# Patient Record
Sex: Female | Born: 1960 | Race: White | Hispanic: No | Marital: Married | Smoking: Former smoker
Health system: Southern US, Community
[De-identification: ages and names within clinical notes are randomized; demographics above are authoritative.]

## PROBLEM LIST (undated history)

## (undated) DIAGNOSIS — Z9889 Other specified postprocedural states: Secondary | ICD-10-CM

## (undated) DIAGNOSIS — Z789 Other specified health status: Secondary | ICD-10-CM

## (undated) DIAGNOSIS — E785 Hyperlipidemia, unspecified: Secondary | ICD-10-CM

## (undated) DIAGNOSIS — I10 Essential (primary) hypertension: Secondary | ICD-10-CM

## (undated) HISTORY — PX: BREAST BIOPSY: SHX20

## (undated) HISTORY — DX: Essential (primary) hypertension: I10

## (undated) HISTORY — PX: BREAST EXCISIONAL BIOPSY: SUR124

## (undated) HISTORY — PX: COLONOSCOPY: SHX174

## (undated) HISTORY — DX: Hyperlipidemia, unspecified: E78.5

## (undated) HISTORY — DX: Other specified postprocedural states: Z98.890

---

## 1999-08-18 ENCOUNTER — Other Ambulatory Visit: Admission: RE | Admit: 1999-08-18 | Discharge: 1999-08-18 | Payer: Self-pay | Admitting: Obstetrics & Gynecology

## 2001-11-22 ENCOUNTER — Other Ambulatory Visit: Admission: RE | Admit: 2001-11-22 | Discharge: 2001-11-22 | Payer: Self-pay | Admitting: Obstetrics & Gynecology

## 2002-12-05 ENCOUNTER — Other Ambulatory Visit: Admission: RE | Admit: 2002-12-05 | Discharge: 2002-12-05 | Payer: Self-pay | Admitting: Obstetrics & Gynecology

## 2003-07-24 ENCOUNTER — Other Ambulatory Visit: Admission: RE | Admit: 2003-07-24 | Discharge: 2003-07-24 | Payer: Self-pay | Admitting: Obstetrics & Gynecology

## 2004-04-13 ENCOUNTER — Other Ambulatory Visit: Admission: RE | Admit: 2004-04-13 | Discharge: 2004-04-13 | Payer: Self-pay | Admitting: Obstetrics & Gynecology

## 2004-04-13 ENCOUNTER — Encounter: Admission: RE | Admit: 2004-04-13 | Discharge: 2004-04-13 | Payer: Self-pay | Admitting: Obstetrics & Gynecology

## 2005-11-09 ENCOUNTER — Encounter: Admission: RE | Admit: 2005-11-09 | Discharge: 2005-11-09 | Payer: Self-pay | Admitting: Obstetrics & Gynecology

## 2006-06-15 ENCOUNTER — Encounter: Admission: RE | Admit: 2006-06-15 | Discharge: 2006-06-15 | Payer: Self-pay | Admitting: Obstetrics & Gynecology

## 2007-06-08 ENCOUNTER — Encounter: Admission: RE | Admit: 2007-06-08 | Discharge: 2007-06-08 | Payer: Self-pay | Admitting: Obstetrics & Gynecology

## 2007-07-05 ENCOUNTER — Encounter: Admission: RE | Admit: 2007-07-05 | Discharge: 2007-07-05 | Payer: Self-pay | Admitting: Obstetrics & Gynecology

## 2008-08-12 ENCOUNTER — Encounter: Admission: RE | Admit: 2008-08-12 | Discharge: 2008-08-12 | Payer: Self-pay | Admitting: Obstetrics & Gynecology

## 2009-08-14 ENCOUNTER — Encounter: Admission: RE | Admit: 2009-08-14 | Discharge: 2009-08-14 | Payer: Self-pay | Admitting: Specialist

## 2009-08-15 ENCOUNTER — Encounter: Admission: RE | Admit: 2009-08-15 | Discharge: 2009-08-15 | Payer: Self-pay | Admitting: Obstetrics & Gynecology

## 2010-06-28 HISTORY — PX: ABDOMINAL HYSTERECTOMY: SHX81

## 2010-07-29 ENCOUNTER — Other Ambulatory Visit: Payer: Self-pay | Admitting: Obstetrics & Gynecology

## 2010-07-29 DIAGNOSIS — Z1239 Encounter for other screening for malignant neoplasm of breast: Secondary | ICD-10-CM

## 2010-08-17 ENCOUNTER — Ambulatory Visit
Admission: RE | Admit: 2010-08-17 | Discharge: 2010-08-17 | Disposition: A | Discharge status: Home / Self Care | Payer: Federal, State, Local not specified - PPO | Source: Ambulatory Visit | Attending: Obstetrics & Gynecology | Admitting: Obstetrics & Gynecology

## 2010-08-17 DIAGNOSIS — Z1239 Encounter for other screening for malignant neoplasm of breast: Secondary | ICD-10-CM

## 2011-03-31 ENCOUNTER — Other Ambulatory Visit: Payer: Self-pay | Admitting: Obstetrics & Gynecology

## 2011-03-31 DIAGNOSIS — N6323 Unspecified lump in the left breast, lower outer quadrant: Secondary | ICD-10-CM

## 2011-03-31 DIAGNOSIS — N6321 Unspecified lump in the left breast, upper outer quadrant: Secondary | ICD-10-CM

## 2011-04-12 ENCOUNTER — Other Ambulatory Visit: Payer: Federal, State, Local not specified - PPO

## 2011-04-15 ENCOUNTER — Ambulatory Visit
Admission: RE | Admit: 2011-04-15 | Discharge: 2011-04-15 | Disposition: A | Discharge status: Home / Self Care | Payer: Federal, State, Local not specified - PPO | Source: Ambulatory Visit | Attending: Obstetrics & Gynecology | Admitting: Obstetrics & Gynecology

## 2011-04-15 DIAGNOSIS — N6321 Unspecified lump in the left breast, upper outer quadrant: Secondary | ICD-10-CM

## 2011-04-15 DIAGNOSIS — N6323 Unspecified lump in the left breast, lower outer quadrant: Secondary | ICD-10-CM

## 2012-02-11 ENCOUNTER — Other Ambulatory Visit: Payer: Self-pay | Admitting: Obstetrics & Gynecology

## 2012-02-11 DIAGNOSIS — Z803 Family history of malignant neoplasm of breast: Secondary | ICD-10-CM

## 2012-02-11 DIAGNOSIS — Z1231 Encounter for screening mammogram for malignant neoplasm of breast: Secondary | ICD-10-CM

## 2012-03-03 ENCOUNTER — Ambulatory Visit: Payer: Federal, State, Local not specified - PPO

## 2012-03-20 ENCOUNTER — Other Ambulatory Visit: Payer: Self-pay | Admitting: Obstetrics & Gynecology

## 2012-03-20 DIAGNOSIS — Z803 Family history of malignant neoplasm of breast: Secondary | ICD-10-CM

## 2012-04-14 ENCOUNTER — Ambulatory Visit: Payer: Federal, State, Local not specified - PPO

## 2012-04-14 ENCOUNTER — Other Ambulatory Visit: Payer: Federal, State, Local not specified - PPO

## 2012-04-21 ENCOUNTER — Ambulatory Visit
Admission: RE | Admit: 2012-04-21 | Discharge: 2012-04-21 | Disposition: A | Discharge status: Home / Self Care | Payer: Federal, State, Local not specified - PPO | Source: Ambulatory Visit | Attending: Obstetrics & Gynecology | Admitting: Obstetrics & Gynecology

## 2012-04-21 DIAGNOSIS — Z1231 Encounter for screening mammogram for malignant neoplasm of breast: Secondary | ICD-10-CM

## 2012-04-21 DIAGNOSIS — Z803 Family history of malignant neoplasm of breast: Secondary | ICD-10-CM

## 2012-04-21 MED ORDER — GADOBENATE DIMEGLUMINE 529 MG/ML IV SOLN
15.0000 mL | Freq: Once | INTRAVENOUS | Status: AC | PRN
  Administered 2012-04-21: 15 mL via INTRAVENOUS

## 2012-10-26 DIAGNOSIS — Z9889 Other specified postprocedural states: Secondary | ICD-10-CM

## 2012-10-26 HISTORY — DX: Other specified postprocedural states: Z98.890

## 2013-04-06 ENCOUNTER — Other Ambulatory Visit: Payer: Self-pay

## 2013-04-06 DIAGNOSIS — Z1231 Encounter for screening mammogram for malignant neoplasm of breast: Secondary | ICD-10-CM

## 2013-04-17 ENCOUNTER — Other Ambulatory Visit: Payer: Self-pay | Admitting: Obstetrics & Gynecology

## 2013-04-17 DIAGNOSIS — N63 Unspecified lump in unspecified breast: Secondary | ICD-10-CM

## 2013-04-30 ENCOUNTER — Other Ambulatory Visit: Payer: Federal, State, Local not specified - PPO

## 2013-05-10 ENCOUNTER — Ambulatory Visit
Admission: RE | Admit: 2013-05-10 | Discharge: 2013-05-10 | Disposition: A | Discharge status: Home / Self Care | Payer: Federal, State, Local not specified - PPO | Source: Ambulatory Visit | Attending: Obstetrics & Gynecology | Admitting: Obstetrics & Gynecology

## 2013-05-10 ENCOUNTER — Other Ambulatory Visit: Payer: Self-pay | Admitting: Obstetrics & Gynecology

## 2013-05-10 DIAGNOSIS — N63 Unspecified lump in unspecified breast: Secondary | ICD-10-CM

## 2013-05-10 DIAGNOSIS — N6320 Unspecified lump in the left breast, unspecified quadrant: Secondary | ICD-10-CM

## 2013-05-18 ENCOUNTER — Ambulatory Visit
Admission: RE | Admit: 2013-05-18 | Discharge: 2013-05-18 | Disposition: A | Discharge status: Home / Self Care | Payer: Federal, State, Local not specified - PPO | Source: Ambulatory Visit | Attending: Obstetrics & Gynecology | Admitting: Obstetrics & Gynecology

## 2013-05-18 ENCOUNTER — Other Ambulatory Visit: Payer: Self-pay | Admitting: Obstetrics & Gynecology

## 2013-05-18 DIAGNOSIS — N6320 Unspecified lump in the left breast, unspecified quadrant: Secondary | ICD-10-CM

## 2013-05-25 ENCOUNTER — Ambulatory Visit: Payer: Federal, State, Local not specified - PPO

## 2013-05-30 ENCOUNTER — Ambulatory Visit (INDEPENDENT_AMBULATORY_CARE_PROVIDER_SITE_OTHER): Payer: Federal, State, Local not specified - PPO | Admitting: Surgery

## 2013-06-08 ENCOUNTER — Ambulatory Visit (INDEPENDENT_AMBULATORY_CARE_PROVIDER_SITE_OTHER): Payer: Federal, State, Local not specified - PPO | Admitting: Surgery

## 2013-06-08 ENCOUNTER — Encounter (INDEPENDENT_AMBULATORY_CARE_PROVIDER_SITE_OTHER): Payer: Self-pay

## 2013-06-08 ENCOUNTER — Encounter (INDEPENDENT_AMBULATORY_CARE_PROVIDER_SITE_OTHER): Payer: Self-pay | Admitting: Surgery

## 2013-06-08 VITALS — BP 122/76 | HR 76 | Temp 98.5°F | Resp 18 | Ht 70.0 in | Wt 161.6 lb

## 2013-06-08 DIAGNOSIS — N6099 Unspecified benign mammary dysplasia of unspecified breast: Secondary | ICD-10-CM | POA: Insufficient documentation

## 2013-06-08 DIAGNOSIS — N62 Hypertrophy of breast: Secondary | ICD-10-CM

## 2013-06-08 NOTE — Progress Notes (Signed)
Patient ID: RUDINE RIEGER, female   DOB: 10-16-1960, 52 y.o.   MRN: [id number]  Chief Complaint  Patient presents with  . Other    breast atypical hyperplasia    HPI Gerilynn S Mickel is a 52 y.o. female.   HPI This is a very pleasant female referred by the breast center and Dr. Derinda Late for evaluation of a recent diagnosis of atypical lobular hyperplasia of left breast. This was found on recent mammography and biopsy. 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 She has never needed a biopsy in the past. She has a family history of breast cancer in her mother and sister. She is otherwise without complaints. She denies nipple discharge. Past Medical History  Diagnosis Date  . H/O colonoscopy 10/2012    Past Surgical History  Procedure Laterality Date  . Abdominal hysterectomy      History reviewed. No pertinent family history.  Social History History  Substance Use Topics  . Smoking status: Former Games developer  . Smokeless tobacco: Not on file  . Alcohol Use: Yes     Comment: 2-4 glasses of wine    No Known Allergies  Current Outpatient Prescriptions  Medication Sig Dispense Refill  . hydrochlorothiazide (HYDRODIURIL) 50 MG tablet        No current facility-administered medications for this visit.    Review of Systems Review of Systems  Constitutional: Negative for fever, chills and unexpected weight change.  HENT: Negative for congestion, hearing loss, sore throat, trouble swallowing and voice change.   Eyes: Negative for visual disturbance.  Respiratory: Negative for cough and wheezing.   Cardiovascular: Negative for chest pain, palpitations and leg swelling.  Gastrointestinal: Negative for nausea, vomiting, abdominal pain, diarrhea, constipation, blood in stool, abdominal distention and anal bleeding.  Genitourinary: Negative for hematuria, vaginal bleeding and difficulty urinating.  Musculoskeletal: Negative for arthralgias.  Skin:  Negative for rash and wound.  Neurological: Negative for seizures, syncope and headaches.  Hematological: Negative for adenopathy. Does not bruise/bleed easily.  Psychiatric/Behavioral: Negative for confusion.    Blood pressure 122/76, pulse 76, temperature 98.5 F (36.9 C), temperature source Temporal, resp. rate 18, height 5\' 10"  (1.778 m), weight 161 lb 9.6 oz (73.301 kg).  Physical Exam Physical Exam  Constitutional: She is oriented to person, place, and time. She appears well-developed and well-nourished. No distress.  HENT:  Head: Normocephalic and atraumatic.  Right Ear: External ear normal.  Left Ear: External ear normal.  Nose: Nose normal.  Mouth/Throat: Oropharynx is clear and moist. No oropharyngeal exudate.  Eyes: Conjunctivae are normal. Pupils are equal, round, and reactive to light. Right eye exhibits no discharge. Left eye exhibits no discharge. No scleral icterus.  Neck: Normal range of motion. Neck supple. No tracheal deviation present. No thyromegaly present.  Cardiovascular: Normal rate, regular rhythm, normal heart sounds and intact distal pulses.   No murmur heard. Pulmonary/Chest: Effort normal and breath sounds normal. No respiratory distress. She has no wheezes. She has no rales.  Musculoskeletal: Normal range of motion. She exhibits no edema and no tenderness.  Lymphadenopathy:    She has no cervical adenopathy.    She has no axillary adenopathy.  Neurological: She is alert and oriented to person, place, and time.  Skin: Skin is warm and dry. No rash noted. She is not diaphoretic. No erythema.  Psychiatric: Her behavior is normal. Judgment normal.  Breast: She has several small palpable masses in the breasts bilaterally which are all smooth  and mobile. Her areola are normal. Her biopsy site is well healed  Data Reviewed I reviewed her mammograms, ultrasound, and pathology report. The pathology shows atypical lobular hyperplasia  Assessment    Atypical  lobular hyperplasia of left breast     Plan    Needle localized left breast lumpectomy is recommended for better histologic evaluation to rule out malignancy. This is especially warranted given her family history of breast cancer.  I discussed the procedure with her in detail. I discussed the risk of procedure which includes but is not limited to bleeding, infection, need for further surgery if malignancy is found, et Karie Soda. She understands and wishes to proceed. Surgery will be scheduled.       Betsey Sossamon A 06/08/2013, 3:26 PM

## 2013-08-21 ENCOUNTER — Encounter (HOSPITAL_BASED_OUTPATIENT_CLINIC_OR_DEPARTMENT_OTHER): Payer: Self-pay | Admitting: *Deleted

## 2013-08-21 NOTE — Progress Notes (Signed)
No labs needed-takes hctz maybe once a month for fluid-has not had in weeks-can do istat if needed

## 2013-08-23 NOTE — H&P (Signed)
Chief Complaint   Patient presents with   .  Other     breast atypical hyperplasia   HPI  Regina Rogers is a 53 y.o. female.  HPI  This is a very pleasant female referred by the breast center and Dr. Shelly Bombard for evaluation of a recent diagnosis of atypical lobular hyperplasia of left breast. This was found on recent mammography and biopsy. 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 She has never needed a biopsy in the past. She has a family history of breast cancer in her mother and sister. She is otherwise without complaints. She denies nipple discharge.  Past Medical History   Diagnosis  Date   .  H/O colonoscopy  10/2012    Past Surgical History   Procedure  Laterality  Date   .  Abdominal hysterectomy     History reviewed. No pertinent family history.  Social History  History   Substance Use Topics   .  Smoking status:  Former Research scientist (life sciences)   .  Smokeless tobacco:  Not on file   .  Alcohol Use:  Yes      Comment: 2-4 glasses of wine   No Known Allergies  Current Outpatient Prescriptions   Medication  Sig  Dispense  Refill   .  hydrochlorothiazide (HYDRODIURIL) 50 MG tablet       No current facility-administered medications for this visit.   Review of Systems  Review of Systems  Constitutional: Negative for fever, chills and unexpected weight change.  HENT: Negative for congestion, hearing loss, sore throat, trouble swallowing and voice change.  Eyes: Negative for visual disturbance.  Respiratory: Negative for cough and wheezing.  Cardiovascular: Negative for chest pain, palpitations and leg swelling.  Gastrointestinal: Negative for nausea, vomiting, abdominal pain, diarrhea, constipation, blood in stool, abdominal distention and anal bleeding.  Genitourinary: Negative for hematuria, vaginal bleeding and difficulty urinating.  Musculoskeletal: Negative for arthralgias.  Skin: Negative for rash and wound.  Neurological: Negative for seizures,  syncope and headaches.  Hematological: Negative for adenopathy. Does not bruise/bleed easily.  Psychiatric/Behavioral: Negative for confusion.  Blood pressure 122/76, pulse 76, temperature 98.5 F (36.9 C), temperature source Temporal, resp. rate 18, height 5\' 10"  (1.778 m), weight 161 lb 9.6 oz (73.301 kg).  Physical Exam  Physical Exam  Constitutional: She is oriented to person, place, and time. She appears well-developed and well-nourished. No distress.  HENT:  Head: Normocephalic and atraumatic.  Right Ear: External ear normal.  Left Ear: External ear normal.  Nose: Nose normal.  Mouth/Throat: Oropharynx is clear and moist. No oropharyngeal exudate.  Eyes: Conjunctivae are normal. Pupils are equal, round, and reactive to light. Right eye exhibits no discharge. Left eye exhibits no discharge. No scleral icterus.  Neck: Normal range of motion. Neck supple. No tracheal deviation present. No thyromegaly present.  Cardiovascular: Normal rate, regular rhythm, normal heart sounds and intact distal pulses.  No murmur heard.  Pulmonary/Chest: Effort normal and breath sounds normal. No respiratory distress. She has no wheezes. She has no rales.  Musculoskeletal: Normal range of motion. She exhibits no edema and no tenderness.  Lymphadenopathy:  She has no cervical adenopathy.  She has no axillary adenopathy.  Neurological: She is alert and oriented to person, place, and time.  Skin: Skin is warm and dry. No rash noted. She is not diaphoretic. No erythema.  Psychiatric: Her behavior is normal. Judgment normal.  Breast: She has several small palpable masses in the  breasts bilaterally which are all smooth and mobile. Her areola are normal. Her biopsy site is well healed  Data Reviewed  I reviewed her mammograms, ultrasound, and pathology report. The pathology shows atypical lobular hyperplasia  Assessment  Atypical lobular hyperplasia of left breast  Plan  Needle localized left breast  lumpectomy is recommended for better histologic evaluation to rule out malignancy. This is especially warranted given her family history of breast cancer. I discussed the procedure with her in detail. I discussed the risk of procedure which includes but is not limited to bleeding, infection, need for further surgery if malignancy is found, et Ronney Asters. She understands and wishes to proceed. Surgery will be scheduled.

## 2013-08-24 ENCOUNTER — Ambulatory Visit (HOSPITAL_BASED_OUTPATIENT_CLINIC_OR_DEPARTMENT_OTHER): Payer: Federal, State, Local not specified - PPO | Admitting: Anesthesiology

## 2013-08-24 ENCOUNTER — Ambulatory Visit
Admission: RE | Admit: 2013-08-24 | Discharge: 2013-08-24 | Disposition: A | Discharge status: Home / Self Care | Payer: Federal, State, Local not specified - PPO | Source: Ambulatory Visit | Attending: Surgery | Admitting: Surgery

## 2013-08-24 ENCOUNTER — Encounter (HOSPITAL_BASED_OUTPATIENT_CLINIC_OR_DEPARTMENT_OTHER): Payer: Federal, State, Local not specified - PPO | Admitting: Anesthesiology

## 2013-08-24 ENCOUNTER — Encounter (HOSPITAL_BASED_OUTPATIENT_CLINIC_OR_DEPARTMENT_OTHER): Payer: Self-pay | Admitting: Anesthesiology

## 2013-08-24 ENCOUNTER — Ambulatory Visit (HOSPITAL_BASED_OUTPATIENT_CLINIC_OR_DEPARTMENT_OTHER)
Admission: RE | Admit: 2013-08-24 | Discharge: 2013-08-24 | Disposition: A | Discharge status: Home / Self Care | Payer: Federal, State, Local not specified - PPO | Source: Ambulatory Visit | Attending: Surgery | Admitting: Surgery

## 2013-08-24 ENCOUNTER — Encounter (HOSPITAL_BASED_OUTPATIENT_CLINIC_OR_DEPARTMENT_OTHER)
Admission: RE | Disposition: A | Discharge status: Home / Self Care | Payer: Self-pay | Source: Ambulatory Visit | Attending: Surgery

## 2013-08-24 DIAGNOSIS — N6099 Unspecified benign mammary dysplasia of unspecified breast: Secondary | ICD-10-CM

## 2013-08-24 DIAGNOSIS — Z87891 Personal history of nicotine dependence: Secondary | ICD-10-CM | POA: Insufficient documentation

## 2013-08-24 DIAGNOSIS — D4860 Neoplasm of uncertain behavior of unspecified breast: Secondary | ICD-10-CM

## 2013-08-24 DIAGNOSIS — N6089 Other benign mammary dysplasias of unspecified breast: Secondary | ICD-10-CM | POA: Insufficient documentation

## 2013-08-24 DIAGNOSIS — Z803 Family history of malignant neoplasm of breast: Secondary | ICD-10-CM | POA: Insufficient documentation

## 2013-08-24 HISTORY — DX: Other specified health status: Z78.9

## 2013-08-24 HISTORY — PX: BREAST LUMPECTOMY WITH NEEDLE LOCALIZATION: SHX5759

## 2013-08-24 LAB — POCT HEMOGLOBIN-HEMACUE: HEMOGLOBIN: 13 g/dL (ref 12.0–15.0)

## 2013-08-24 SURGERY — BREAST LUMPECTOMY WITH NEEDLE LOCALIZATION
Anesthesia: General | Laterality: Left

## 2013-08-24 MED ORDER — CEFAZOLIN SODIUM-DEXTROSE 2-3 GM-% IV SOLR
2.0000 g | INTRAVENOUS | Status: AC
  Administered 2013-08-24: 2 g via INTRAVENOUS

## 2013-08-24 MED ORDER — FENTANYL CITRATE 0.05 MG/ML IJ SOLN
INTRAMUSCULAR | Status: DC | PRN
  Administered 2013-08-24: 100 ug via INTRAVENOUS

## 2013-08-24 MED ORDER — MIDAZOLAM HCL 2 MG/2ML IJ SOLN
INTRAMUSCULAR | Status: AC
  Filled 2013-08-24: qty 2

## 2013-08-24 MED ORDER — BUPIVACAINE-EPINEPHRINE PF 0.5-1:200000 % IJ SOLN
INTRAMUSCULAR | Status: AC
  Filled 2013-08-24: qty 30

## 2013-08-24 MED ORDER — MIDAZOLAM HCL 5 MG/5ML IJ SOLN
INTRAMUSCULAR | Status: DC | PRN
  Administered 2013-08-24: 1 mg via INTRAVENOUS

## 2013-08-24 MED ORDER — PROPOFOL 10 MG/ML IV BOLUS
INTRAVENOUS | Status: AC
  Filled 2013-08-24: qty 20

## 2013-08-24 MED ORDER — FENTANYL CITRATE 0.05 MG/ML IJ SOLN: 50.0000 ug | INTRAMUSCULAR | Status: DC | PRN

## 2013-08-24 MED ORDER — BUPIVACAINE-EPINEPHRINE 0.5% -1:200000 IJ SOLN
INTRAMUSCULAR | Status: DC | PRN
  Administered 2013-08-24: 10 mL

## 2013-08-24 MED ORDER — SODIUM BICARBONATE 4 % IV SOLN
INTRAVENOUS | Status: AC
  Filled 2013-08-24: qty 5

## 2013-08-24 MED ORDER — LIDOCAINE HCL (CARDIAC) 20 MG/ML IV SOLN
INTRAVENOUS | Status: DC | PRN
  Administered 2013-08-24: 40 mg via INTRAVENOUS

## 2013-08-24 MED ORDER — LIDOCAINE HCL (PF) 1 % IJ SOLN
INTRAMUSCULAR | Status: AC
  Filled 2013-08-24: qty 30

## 2013-08-24 MED ORDER — MIDAZOLAM HCL 2 MG/2ML IJ SOLN: 1.0000 mg | INTRAMUSCULAR | Status: DC | PRN

## 2013-08-24 MED ORDER — LACTATED RINGERS IV SOLN
INTRAVENOUS | Status: DC
  Administered 2013-08-24: 15:00:00 via INTRAVENOUS

## 2013-08-24 MED ORDER — ONDANSETRON HCL 4 MG/2ML IJ SOLN
INTRAMUSCULAR | Status: DC | PRN
  Administered 2013-08-24: 4 mg via INTRAVENOUS

## 2013-08-24 MED ORDER — FENTANYL CITRATE 0.05 MG/ML IJ SOLN
INTRAMUSCULAR | Status: AC
  Filled 2013-08-24: qty 4

## 2013-08-24 MED ORDER — DEXAMETHASONE SODIUM PHOSPHATE 4 MG/ML IJ SOLN
INTRAMUSCULAR | Status: DC | PRN
  Administered 2013-08-24: 10 mg via INTRAVENOUS

## 2013-08-24 MED ORDER — PROPOFOL 10 MG/ML IV BOLUS
INTRAVENOUS | Status: DC | PRN
  Administered 2013-08-24: 150 mg via INTRAVENOUS

## 2013-08-24 MED ORDER — HYDROCODONE-ACETAMINOPHEN 5-325 MG PO TABS: 1.0000 | ORAL_TABLET | ORAL | Status: DC | PRN

## 2013-08-24 SURGICAL SUPPLY — 45 items
APL SKNCLS STERI-STRIP NONHPOA (GAUZE/BANDAGES/DRESSINGS) ×1
BENZOIN TINCTURE PRP APPL 2/3 (GAUZE/BANDAGES/DRESSINGS) ×3 IMPLANT
BLADE HEX COATED 2.75 (ELECTRODE) ×3 IMPLANT
BLADE SURG 15 STRL LF DISP TIS (BLADE) ×1 IMPLANT
BLADE SURG 15 STRL SS (BLADE) ×3
CANISTER SUCT 1200ML W/VALVE (MISCELLANEOUS) ×2 IMPLANT
CHLORAPREP W/TINT 26ML (MISCELLANEOUS) ×3 IMPLANT
CLIP TI WIDE RED SMALL 6 (CLIP) IMPLANT
CLOSURE WOUND 1/2 X4 (GAUZE/BANDAGES/DRESSINGS) ×1
COVER MAYO STAND STRL (DRAPES) ×3 IMPLANT
COVER TABLE BACK 60X90 (DRAPES) ×3 IMPLANT
DECANTER SPIKE VIAL GLASS SM (MISCELLANEOUS) IMPLANT
DEVICE DUBIN W/COMP PLATE 8390 (MISCELLANEOUS) ×2 IMPLANT
DRAPE PED LAPAROTOMY (DRAPES) ×3 IMPLANT
DRAPE UTILITY XL STRL (DRAPES) ×3 IMPLANT
DRSG TEGADERM 4X4.75 (GAUZE/BANDAGES/DRESSINGS) ×3 IMPLANT
ELECT REM PT RETURN 9FT ADLT (ELECTROSURGICAL) ×3
ELECTRODE REM PT RTRN 9FT ADLT (ELECTROSURGICAL) ×1 IMPLANT
GLOVE BIOGEL M 7.0 STRL (GLOVE) ×2 IMPLANT
GLOVE EXAM NITRILE MD LF STRL (GLOVE) ×2 IMPLANT
GLOVE SURG SIGNA 7.5 PF LTX (GLOVE) ×3 IMPLANT
GOWN STRL REUS W/ TWL LRG LVL3 (GOWN DISPOSABLE) ×1 IMPLANT
GOWN STRL REUS W/ TWL XL LVL3 (GOWN DISPOSABLE) ×1 IMPLANT
GOWN STRL REUS W/TWL LRG LVL3 (GOWN DISPOSABLE) ×3
GOWN STRL REUS W/TWL XL LVL3 (GOWN DISPOSABLE) ×3
KIT MARKER MARGIN INK (KITS) ×3 IMPLANT
NDL HYPO 25X1 1.5 SAFETY (NEEDLE) ×1 IMPLANT
NEEDLE HYPO 25X1 1.5 SAFETY (NEEDLE) ×3 IMPLANT
NS IRRIG 1000ML POUR BTL (IV SOLUTION) ×3 IMPLANT
PACK BASIN DAY SURGERY FS (CUSTOM PROCEDURE TRAY) ×3 IMPLANT
PENCIL BUTTON HOLSTER BLD 10FT (ELECTRODE) ×3 IMPLANT
SLEEVE SCD COMPRESS KNEE MED (MISCELLANEOUS) ×2 IMPLANT
SPONGE GAUZE 4X4 12PLY STER LF (GAUZE/BANDAGES/DRESSINGS) ×3 IMPLANT
SPONGE LAP 4X18 X RAY DECT (DISPOSABLE) ×3 IMPLANT
STRIP CLOSURE SKIN 1/2X4 (GAUZE/BANDAGES/DRESSINGS) ×2 IMPLANT
SUT MNCRL AB 4-0 PS2 18 (SUTURE) ×3 IMPLANT
SUT SILK 2 0 SH (SUTURE) ×3 IMPLANT
SUT VIC AB 3-0 SH 27 (SUTURE) ×3
SUT VIC AB 3-0 SH 27X BRD (SUTURE) ×1 IMPLANT
SYR CONTROL 10ML LL (SYRINGE) ×3 IMPLANT
TOWEL OR 17X24 6PK STRL BLUE (TOWEL DISPOSABLE) ×3 IMPLANT
TOWEL OR NON WOVEN STRL DISP B (DISPOSABLE) ×3 IMPLANT
TUBE CONNECTING 20'X1/4 (TUBING) ×1
TUBE CONNECTING 20X1/4 (TUBING) ×1 IMPLANT
YANKAUER SUCT BULB TIP NO VENT (SUCTIONS) ×2 IMPLANT

## 2013-08-24 NOTE — Transfer of Care (Signed)
Immediate Anesthesia Transfer of Care Note  Patient: Regina Rogers  Procedure(s) Performed: Procedure(s): BREAST LUMPECTOMY WITH NEEDLE LOCALIZATION (Left)  Patient Location: PACU  Anesthesia Type:General  Level of Consciousness: awake, alert , oriented and patient cooperative  Airway & Oxygen Therapy: Patient Spontanous Breathing and Patient connected to face mask oxygen  Post-op Assessment: Report given to PACU RN and Post -op Vital signs reviewed and stable  Post vital signs: Reviewed and stable  Complications: No apparent anesthesia complications

## 2013-08-24 NOTE — Anesthesia Preprocedure Evaluation (Signed)
Anesthesia Evaluation    Airway        Dental   Pulmonary former smoker,          Cardiovascular     Neuro/Psych    GI/Hepatic   Endo/Other    Renal/GU      Musculoskeletal   Abdominal   Peds  Hematology   Anesthesia Other Findings   Reproductive/Obstetrics                           Anesthesia Physical Anesthesia Plan  ASA: II  Anesthesia Plan:    Post-op Pain Management:    Induction:   Airway Management Planned:   Additional Equipment:   Intra-op Plan:   Post-operative Plan:   Informed Consent:   Plan Discussed with:   Anesthesia Plan Comments:         Anesthesia Quick Evaluation  

## 2013-08-24 NOTE — Discharge Instructions (Signed)
°Post Anesthesia Home Care Instructions ° °Activity: °Get plenty of rest for the remainder of the day. A responsible adult should stay with you for 24 hours following the procedure.  °For the next 24 hours, DO NOT: °-Drive a car °-Operate machinery °-Drink alcoholic beverages °-Take any medication unless instructed by your physician °-Make any legal decisions or sign important papers. ° °Meals: °Start with liquid foods such as gelatin or soup. Progress to regular foods as tolerated. Avoid greasy, spicy, heavy foods. If nausea and/or vomiting occur, drink only clear liquids until the nausea and/or vomiting subsides. Call your physician if vomiting continues. ° °Special Instructions/Symptoms: °Your throat may feel dry or sore from the anesthesia or the breathing tube placed in your throat during surgery. If this causes discomfort, gargle with warm salt water. The discomfort should disappear within 24 hours. ° ° ° ° ° ° ° °[facility] °Office Phone Number [phone] ° °BREAST BIOPSY/ PARTIAL MASTECTOMY: POST OP INSTRUCTIONS ° °Always review your discharge instruction sheet given to you by the facility where your surgery was performed. ° °IF YOU HAVE DISABILITY OR FAMILY LEAVE FORMS, YOU MUST BRING THEM TO THE OFFICE FOR PROCESSING.  DO NOT GIVE THEM TO YOUR DOCTOR. ° °1. A prescription for pain medication may be given to you upon discharge.  Take your pain medication as prescribed, if needed.  If narcotic pain medicine is not needed, then you may take acetaminophen (Tylenol) or ibuprofen (Advil) as needed. °2. Take your usually prescribed medications unless otherwise directed °3. If you need a refill on your pain medication, please contact your pharmacy.  They will contact our office to request authorization.  Prescriptions will not be filled after 5pm or on week-ends. °4. You should eat very light the first 24 hours after surgery, such as soup, crackers, pudding, etc.  Resume your normal diet the  day after surgery. °5. Most patients will experience some swelling and bruising in the breast.  Ice packs and a good support bra will help.  Swelling and bruising can take several days to resolve.  °6. It is common to experience some constipation if taking pain medication after surgery.  Increasing fluid intake and taking a stool softener will usually help or prevent this problem from occurring.  A mild laxative (Milk of Magnesia or Miralax) should be taken according to package directions if there are no bowel movements after 48 hours. °7. Unless discharge instructions indicate otherwise, you may remove your bandages 24-48 hours after surgery, and you may shower at that time.  You may have steri-strips (small skin tapes) in place directly over the incision.  These strips should be left on the skin for 7-10 days.  If your surgeon used skin glue on the incision, you may shower in 24 hours.  The glue will flake off over the next 2-3 weeks.  Any sutures or staples will be removed at the office during your follow-up visit. °8. ACTIVITIES:  You may resume regular daily activities (gradually increasing) beginning the next day.  Wearing a good support bra or sports bra minimizes pain and swelling.  You may have sexual intercourse when it is comfortable. °a. You may drive when you no longer are taking prescription pain medication, you can comfortably wear a seatbelt, and you can safely maneuver your car and apply brakes. °b. RETURN TO WORK:  ______________________________________________________________________________________ °9. You should see your doctor in the office for a follow-up appointment approximately two weeks after your surgery.  Your doctor’s nurse will   your follow-up appointment when she calls you with your pathology report.  Expect your pathology report 2-3 business days after your surgery.  You may call to check if you do not hear from [place] after three days. 10. OTHER INSTRUCTIONS:  _______________________________________________________________________________________________ _____________________________________________________________________________________________________________________________________ _____________________________________________________________________________________________________________________________________ _____________________________________________________________________________________________________________________________________  WHEN TO CALL YOUR DOCTOR: 1. Fever over 101.0 2. Nausea and/or vomiting. 3. Extreme swelling or bruising. 4. Continued bleeding from incision. 5. Increased pain, redness, or drainage from the incision.  The clinic staff is available to answer your questions during regular business hours.  Please dont hesitate to call and ask to speak to one of the nurses for clinical concerns.  If you have a medical emergency, go to the nearest emergency room or call 911.  A surgeon from [facility] is always on call at the hospital.  For further questions, please visit [email or web address]

## 2013-08-24 NOTE — Op Note (Signed)
BREAST LUMPECTOMY WITH NEEDLE LOCALIZATION  Procedure Note  Quinci S Papadopoulos 08/24/2013   Pre-op Diagnosis: atypical lobular hyperplasia left breast     Post-op Diagnosis: same  Procedure(s): LEFT BREAST LUMPECTOMY WITH NEEDLE LOCALIZATION  Surgeon(s): Harl Bowie, MD  Anesthesia: General  Staff:  Circulator: Vara Guardian, RN Scrub Person: Irineo Axon Bouchillon, RN  Estimated Blood Loss: Minimal               Specimens: sent to path          [facility]   Date: 08/24/2013  Time: 3:40 PM

## 2013-08-24 NOTE — Anesthesia Procedure Notes (Signed)
Procedure Name: LMA Insertion Date/Time: 08/24/2013 3:07 PM Performed by: Baxter Flattery Pre-anesthesia Checklist: Patient identified, Emergency Drugs available, Suction available and Patient being monitored Patient Re-evaluated:Patient Re-evaluated prior to inductionOxygen Delivery Method: Circle System Utilized Preoxygenation: Pre-oxygenation with 100% oxygen Intubation Type: IV induction Ventilation: Mask ventilation without difficulty LMA: LMA inserted LMA Size: 4.0 Number of attempts: 1 Airway Equipment and Method: bite block Placement Confirmation: positive ETCO2 and breath sounds checked- equal and bilateral Tube secured with: Tape Dental Injury: Teeth and Oropharynx as per pre-operative assessment

## 2013-08-24 NOTE — Anesthesia Postprocedure Evaluation (Signed)
  Anesthesia Post-op Note  Patient: Regina Rogers  Procedure(s) Performed: Procedure(s): BREAST LUMPECTOMY WITH NEEDLE LOCALIZATION (Left)  Patient Location: PACU  Anesthesia Type:General  Level of Consciousness: awake, alert  and oriented  Airway and Oxygen Therapy: Patient Spontanous Breathing  Post-op Pain: none  Post-op Assessment: Post-op Vital signs reviewed, Patient's Cardiovascular Status Stable, Respiratory Function Stable, Patent Airway and Pain level controlled  Post-op Vital Signs: stable  Complications: No apparent anesthesia complications

## 2013-08-24 NOTE — Interval H&P Note (Signed)
History and Physical Interval Note:no change in H and P  08/24/2013 2:29 PM  Regina Rogers  has presented today for surgery, with the diagnosis of atypical lobular hyperplasia left breast  The various methods of treatment have been discussed with the patient and family. After consideration of risks, benefits and other options for treatment, the patient has consented to  Procedure(s): BREAST LUMPECTOMY WITH NEEDLE LOCALIZATION (Left) as a surgical intervention .  The patient's history has been reviewed, patient examined, no change in status, stable for surgery.  I have reviewed the patient's chart and labs.  Questions were answered to the patient's satisfaction.     [name]

## 2013-08-27 ENCOUNTER — Encounter (HOSPITAL_BASED_OUTPATIENT_CLINIC_OR_DEPARTMENT_OTHER): Payer: Self-pay | Admitting: Surgery

## 2013-08-28 NOTE — Op Note (Signed)
NAME:  Regina Rogers, Regina Rogers                 ACCOUNT NO.:  MEDICAL RECORD NO.:  [id number]  LOCATION:                                 FACILITY:  PHYSICIAN:  Coralie Keens, M.D.      DATE OF BIRTH:  DATE OF PROCEDURE:  08/24/2013 DATE OF DISCHARGE:                              OPERATIVE REPORT   PREOPERATIVE DIAGNOSIS:  Atypical lobular hyperplasia of the left breast.  POSTOPERATIVE DIAGNOSIS:  Atypical lobular hyperplasia of the left breast.  PROCEDURE:  Needle localized left breast lumpectomy.  SURGEON:  Coralie Keens, MD  ANESTHESIA:  General and 0.5% Marcaine.  ESTIMATED BLOOD LOSS:  Minimal.  INDICATIONS:  This is a 54 year old female, who appeared to have an abnormal mass on a left breast mammogram.  Stereotactic biopsy was performed showing atypical lobular hyperplasia.  Decision was made to proceed with needle localized left breast lumpectomy.  FINDINGS:  The suspicious area with marker were found to be in the lumpectomy specimen on x-ray post lumpectomy.  Wide margins appeared to be achieved grossly.  PROCEDURE IN DETAIL:  The patient was brought to the operating room and identified as Regina Rogers.  She had already had a localization wire placed in the left breast at the [facility].  She was placed supine on the operating table.  General anesthesia was induced.  Her left breast and wire were then prepped and draped in usual sterile fashion. The wire was then pulled to the outer lower quadrant.  I anesthetized the skin with Marcaine and performed elliptical incision around the wire with a scalpel.  I took this down to the breast tissue with electrocautery.  I then performed a wide lumpectomy with electrocautery. I did identify a large cyst which contained thick milky almost sebaceous material.  This was completely excised in the lumpectomy specimen.  Once the specimen was removed, I marked all quadrants with marker paint, and x-ray was performed  demonstrating the suspicious area was in the specimen with adequate circumferential margins.  The specimen was then sent to pathology for evaluation.  I irrigated the wound with saline.  I achieved hemostasis with cautery.  I anesthetized it further with Marcaine.  I closed the subcutaneous tissue with interrupted 3-0 Vicryl sutures and closed the skin with a running 4-0 Monocryl. Steri-Strips, gauze, and Tegaderm were then applied.  The patient tolerated the procedure well.  All counts were correct at the end of the procedure.  The patient was then extubated in the operating room and taken in stable condition to recovery room.     Coralie Keens, M.D.     DB/MEDQ  D:  08/24/2013  T:  08/24/2013  Job:  [id number]

## 2014-04-17 ENCOUNTER — Other Ambulatory Visit: Payer: Self-pay | Admitting: Obstetrics & Gynecology

## 2014-04-18 LAB — CYTOLOGY - PAP

## 2015-04-22 ENCOUNTER — Other Ambulatory Visit: Payer: Self-pay

## 2015-04-22 DIAGNOSIS — Z1231 Encounter for screening mammogram for malignant neoplasm of breast: Secondary | ICD-10-CM

## 2015-05-08 ENCOUNTER — Ambulatory Visit: Payer: Federal, State, Local not specified - PPO

## 2015-05-09 ENCOUNTER — Ambulatory Visit: Payer: Federal, State, Local not specified - PPO

## 2015-05-13 ENCOUNTER — Ambulatory Visit
Admission: RE | Admit: 2015-05-13 | Discharge: 2015-05-13 | Disposition: A | Discharge status: Home / Self Care | Payer: Federal, State, Local not specified - PPO | Source: Ambulatory Visit

## 2015-05-13 DIAGNOSIS — Z1231 Encounter for screening mammogram for malignant neoplasm of breast: Secondary | ICD-10-CM

## 2015-05-15 ENCOUNTER — Other Ambulatory Visit: Payer: Self-pay | Admitting: Obstetrics & Gynecology

## 2015-05-15 DIAGNOSIS — R928 Other abnormal and inconclusive findings on diagnostic imaging of breast: Secondary | ICD-10-CM

## 2015-06-03 ENCOUNTER — Ambulatory Visit
Admission: RE | Admit: 2015-06-03 | Discharge: 2015-06-03 | Disposition: A | Discharge status: Home / Self Care | Payer: Federal, State, Local not specified - PPO | Source: Ambulatory Visit | Attending: Obstetrics & Gynecology | Admitting: Obstetrics & Gynecology

## 2015-06-03 DIAGNOSIS — R928 Other abnormal and inconclusive findings on diagnostic imaging of breast: Secondary | ICD-10-CM

## 2016-04-28 ENCOUNTER — Other Ambulatory Visit: Payer: Self-pay | Admitting: Obstetrics & Gynecology

## 2016-04-28 DIAGNOSIS — N644 Mastodynia: Secondary | ICD-10-CM

## 2016-04-30 ENCOUNTER — Ambulatory Visit
Admission: RE | Admit: 2016-04-30 | Discharge: 2016-04-30 | Disposition: A | Discharge status: Home / Self Care | Payer: Federal, State, Local not specified - PPO | Source: Ambulatory Visit | Attending: Obstetrics & Gynecology | Admitting: Obstetrics & Gynecology

## 2016-04-30 DIAGNOSIS — N644 Mastodynia: Secondary | ICD-10-CM

## 2016-05-11 ENCOUNTER — Other Ambulatory Visit: Payer: Self-pay | Admitting: Obstetrics & Gynecology

## 2016-05-11 DIAGNOSIS — Z803 Family history of malignant neoplasm of breast: Secondary | ICD-10-CM

## 2017-03-30 ENCOUNTER — Ambulatory Visit (INDEPENDENT_AMBULATORY_CARE_PROVIDER_SITE_OTHER): Payer: Self-pay | Admitting: Orthopedic Surgery

## 2017-04-13 ENCOUNTER — Encounter (INDEPENDENT_AMBULATORY_CARE_PROVIDER_SITE_OTHER): Payer: Self-pay | Admitting: Family

## 2017-04-13 ENCOUNTER — Ambulatory Visit (INDEPENDENT_AMBULATORY_CARE_PROVIDER_SITE_OTHER): Payer: Federal, State, Local not specified - PPO | Admitting: Family

## 2017-04-13 DIAGNOSIS — M79672 Pain in left foot: Secondary | ICD-10-CM

## 2017-04-13 DIAGNOSIS — M6702 Short Achilles tendon (acquired), left ankle: Secondary | ICD-10-CM | POA: Diagnosis not present

## 2017-04-13 LAB — URIC ACID: URIC ACID, SERUM: 3.3 mg/dL (ref 2.5–7.0)

## 2017-04-13 NOTE — Progress Notes (Signed)
Office Visit Note   Patient: Regina Rogers           Date of Birth: [birth year]           MRN: [id number] Visit Date: 04/13/2017              Requested by: Aletha Halim., PA-C [id number], [place] [address] PCP: Aletha Halim., PA-C  Chief Complaint  Patient presents with  . Left Foot - Pain      HPI: The patient is a 56 year old woman who presents today complaining of 4 week history of left foot pain and swelling. Did notice this came on about 2 days after an evening she wore heels, is unsure if was related. this is been over the dorsum of her left forefoot. Has been worse with ambulation. Has been seen him twice for the same problem by urgent care as well as her primary care. Denies numbness or tingling.   Has had radiographs of her left foot which were negative for fracture. These were reviewed today.  States his been taking Aleve as needed for the last several days with moderate relief. Has also obtain new him flip-flops with good arch supports states these are much more comfortable for walking.  Did have some lab work done on 03/29/17. Her ESR was elevated at 28.  Assessment & Plan: Visit Diagnoses:  1. Pain in left foot   2. Heel cord tightness, left     Plan: we will check a uric acid. Discussed the importance of heel cord stretching demonstrated for patient. Patient to go to shoe market and obtain new shoe wear with good arch support. She'll take Aleve twice daily for the next 2 weeks. Will follow-up with Dr. Sharol Given if no improvement.  Follow-Up Instructions: Return in about 3 weeks (around 05/04/2017).   Ortho Exam  Patient is alert, oriented, no adenopathy, well-dressed, normal affect, normal respiratory effort. On examination of left foot. Foot is plantigrade. Mild edema over dorsum of foot. No erythema or warmth. Tenderness to midfoot and forefoot dorsally. MT heads nontender. No pain with lateral compression of MT heads. 3rd webspace  nontender.   Imaging: No results found. No images are attached to the encounter.  Labs: No results found for: HGBA1C, ESRSEDRATE, CRP, LABURIC, REPTSTATUS, GRAMSTAIN, CULT, LABORGA  Orders:  Orders Placed This Encounter  Procedures  . Uric acid   No orders of the defined types were placed in this encounter.    Procedures: No procedures performed  Clinical Data: No additional findings.  ROS:  All other systems negative, except as noted in the HPI. Review of Systems  Constitutional: Negative for chills and fever.  Musculoskeletal: Positive for arthralgias and joint swelling.  Skin: Negative for color change and wound.  Neurological: Negative for weakness and numbness.    Objective: Vital Signs: LMP 03/08/2011   Specialty Comments:  No specialty comments available.  PMFS History: Patient Active Problem List   Diagnosis Date Noted  . Breast atypical lobular hyperplasia 06/08/2013   Past Medical History:  Diagnosis Date  . H/O colonoscopy 10/2012  . Medical history non-contributory     History reviewed. No pertinent family history.  Past Surgical History:  Procedure Laterality Date  . ABDOMINAL HYSTERECTOMY  2012  . BREAST LUMPECTOMY WITH NEEDLE LOCALIZATION Left 08/24/2013   Procedure: BREAST LUMPECTOMY WITH NEEDLE LOCALIZATION;  Surgeon: Harl Bowie, MD;  Location: [facility];  Service: General;  Laterality: Left;  . COLONOSCOPY  Social History   Occupational History  . Not on file.   Social History Main Topics  . Smoking status: Former Smoker    Quit date: 08/21/1990  . Smokeless tobacco: Never Used  . Alcohol use Yes     Comment: 2-4 glasses of wine  . Drug use: No  . Sexual activity: Not on file

## 2017-04-14 ENCOUNTER — Telehealth (INDEPENDENT_AMBULATORY_CARE_PROVIDER_SITE_OTHER): Payer: Self-pay | Admitting: Radiology

## 2017-04-14 NOTE — Telephone Encounter (Signed)
-----   Message from Suzan Slick, NP sent at 04/14/2017  9:33 AM EDT ----- Will you call and let know no gout  ----- Message ----- From: Cheyenne Adas Lab Results In Sent: 04/13/2017  11:22 PM To: Suzan Slick, NP

## 2017-04-14 NOTE — Telephone Encounter (Signed)
I called and left voicemail for the patient advised uric acid was within normal limits she does not have gout. Continue with heel cord stretching, new shoes with good arch supports and aleve 2 po bid. If she is not feeling relief within the next couple weeks schedule follow up with Dr. Sharol Given.

## 2017-04-29 ENCOUNTER — Other Ambulatory Visit: Payer: Self-pay | Admitting: Obstetrics & Gynecology

## 2017-04-29 DIAGNOSIS — Z1231 Encounter for screening mammogram for malignant neoplasm of breast: Secondary | ICD-10-CM

## 2017-05-17 ENCOUNTER — Other Ambulatory Visit: Payer: Self-pay | Admitting: Family Medicine

## 2017-05-17 DIAGNOSIS — R200 Anesthesia of skin: Secondary | ICD-10-CM

## 2017-05-17 DIAGNOSIS — M79605 Pain in left leg: Secondary | ICD-10-CM

## 2017-05-17 DIAGNOSIS — M7989 Other specified soft tissue disorders: Secondary | ICD-10-CM

## 2017-05-17 DIAGNOSIS — M79604 Pain in right leg: Secondary | ICD-10-CM

## 2017-05-18 ENCOUNTER — Other Ambulatory Visit: Payer: Federal, State, Local not specified - PPO

## 2017-05-24 ENCOUNTER — Ambulatory Visit: Payer: Federal, State, Local not specified - PPO

## 2017-05-27 ENCOUNTER — Ambulatory Visit
Admission: RE | Admit: 2017-05-27 | Discharge: 2017-05-27 | Disposition: A | Discharge status: Home / Self Care | Payer: Federal, State, Local not specified - PPO | Source: Ambulatory Visit | Attending: Family Medicine | Admitting: Family Medicine

## 2017-05-27 ENCOUNTER — Ambulatory Visit: Payer: Federal, State, Local not specified - PPO

## 2017-05-27 DIAGNOSIS — R200 Anesthesia of skin: Secondary | ICD-10-CM

## 2017-05-27 DIAGNOSIS — M7989 Other specified soft tissue disorders: Secondary | ICD-10-CM

## 2017-05-27 DIAGNOSIS — M79605 Pain in left leg: Secondary | ICD-10-CM

## 2017-05-27 DIAGNOSIS — M79604 Pain in right leg: Secondary | ICD-10-CM

## 2017-05-31 ENCOUNTER — Ambulatory Visit
Admission: RE | Admit: 2017-05-31 | Discharge: 2017-05-31 | Disposition: A | Discharge status: Home / Self Care | Payer: Federal, State, Local not specified - PPO | Source: Ambulatory Visit | Attending: Obstetrics & Gynecology | Admitting: Obstetrics & Gynecology

## 2017-05-31 DIAGNOSIS — Z1231 Encounter for screening mammogram for malignant neoplasm of breast: Secondary | ICD-10-CM

## 2017-06-07 ENCOUNTER — Other Ambulatory Visit: Payer: Self-pay | Admitting: Obstetrics & Gynecology

## 2017-06-07 DIAGNOSIS — Z803 Family history of malignant neoplasm of breast: Secondary | ICD-10-CM

## 2017-07-08 ENCOUNTER — Other Ambulatory Visit: Payer: Federal, State, Local not specified - PPO

## 2018-07-06 ENCOUNTER — Other Ambulatory Visit: Payer: Self-pay | Admitting: Obstetrics & Gynecology

## 2018-07-06 DIAGNOSIS — Z1231 Encounter for screening mammogram for malignant neoplasm of breast: Secondary | ICD-10-CM

## 2018-08-03 ENCOUNTER — Ambulatory Visit
Admission: RE | Admit: 2018-08-03 | Discharge: 2018-08-03 | Disposition: A | Discharge status: Home / Self Care | Payer: Federal, State, Local not specified - PPO | Source: Ambulatory Visit | Attending: Obstetrics & Gynecology | Admitting: Obstetrics & Gynecology

## 2018-08-03 DIAGNOSIS — Z1231 Encounter for screening mammogram for malignant neoplasm of breast: Secondary | ICD-10-CM

## 2018-08-04 ENCOUNTER — Ambulatory Visit: Payer: Federal, State, Local not specified - PPO

## 2018-08-21 ENCOUNTER — Encounter (INDEPENDENT_AMBULATORY_CARE_PROVIDER_SITE_OTHER): Payer: Self-pay | Admitting: Orthopedic Surgery

## 2018-08-21 ENCOUNTER — Ambulatory Visit (INDEPENDENT_AMBULATORY_CARE_PROVIDER_SITE_OTHER): Payer: Federal, State, Local not specified - PPO | Admitting: Physician Assistant

## 2018-08-21 ENCOUNTER — Ambulatory Visit (INDEPENDENT_AMBULATORY_CARE_PROVIDER_SITE_OTHER): Payer: Federal, State, Local not specified - PPO

## 2018-08-21 VITALS — Ht 70.0 in | Wt 161.0 lb

## 2018-08-21 DIAGNOSIS — M1712 Unilateral primary osteoarthritis, left knee: Secondary | ICD-10-CM

## 2018-08-21 DIAGNOSIS — G8929 Other chronic pain: Secondary | ICD-10-CM

## 2018-08-21 DIAGNOSIS — M25562 Pain in left knee: Secondary | ICD-10-CM | POA: Diagnosis not present

## 2018-08-21 MED ORDER — DICLOFENAC SODIUM 3 % TD GEL: 2.0000 g | Freq: Four times a day (QID) | TRANSDERMAL | 2 refills | Status: DC | PRN

## 2018-08-21 NOTE — Progress Notes (Signed)
Office Visit Note   Patient: Regina Rogers           Date of Birth: 1960/10/04           MRN: [id number] Visit Date: 08/21/2018              Requested by: Aletha Halim., PA-C [address] [place] [address] PCP: Aletha Halim., PA-C  Chief Complaint  Patient presents with  . Left Knee - Pain      HPI: The patient is a 58 year old woman who is seen for complaints of left knee pain.  She reports is been ongoing for several months.  She has some intermittent swelling.  She particularly notices pain when she is going up and down steps.  She reported that she retired a little more than a year ago and was going to the gym daily but did not quit and just started trying to go back and noticed more and more difficulty with the knee.  She has been taking ibuprofen as needed for the discomfort but does not notice a lot of difference with this.  Assessment & Plan: Visit Diagnoses:  1. Unilateral primary osteoarthritis, left knee   2. Chronic pain of left knee     Plan: Counseled the patient that her radiographs show some arthritic changes particularly about the medial joint.  The patient was offered a steroid injection but she would rather try other methods at this point.  We discussed utilizing some diclofenac gel and she would like to try this.  She was also given instructions on strengthening exercises for the knee specifically for quadricep strengthening including straight leg raises and other methods as a AOS guidelines recommend.  We will have her follow-up in about 4 weeks or sooner if she has difficulties in the interim.  Follow-Up Instructions: Return in about 4 weeks (around 09/18/2018), or if symptoms worsen or fail to improve.   Ortho Exam  Patient is alert, oriented, no adenopathy, well-dressed, normal affect, normal respiratory effort. Left knee has a very minimal effusion.  Her range of motion is 0 to 120 degrees with minimal minimal pain on end  range.  She has no instability with varus valgus stress.  Negative drawers.  She does have some mild crepitus with knee range of motion.  Imaging: Xr Knee 1-2 Views Left  Result Date: 08/21/2018 Radiographs of the left knee show mild arthritic changes about the medial joint with joint space narrowing and subchondral sclerosis.  She also has some minimal arthritic changes about the patellofemoral joint.  There is no signs of acute fracture or other osseous abnormality.  No images are attached to the encounter.  Labs: Lab Results  Component Value Date   LABURIC 3.3 04/13/2017     Lab Results  Component Value Date   LABURIC 3.3 04/13/2017    Body mass index is 23.1 kg/m.  Orders:  Orders Placed This Encounter  Procedures  . XR Knee 1-2 Views Left   Meds ordered this encounter  Medications  . Diclofenac Sodium 3 % GEL    Sig: Place 2 g onto the skin 4 (four) times daily as needed.    Dispense:  1 Tube    Refill:  2     Procedures: No procedures performed  Clinical Data: No additional findings.  ROS:  All other systems negative, except as noted in the HPI. Review of Systems  Objective: Vital Signs: Ht 5\' 10"  (1.778 m)   Wt 161  lb (73 kg)   LMP 03/08/2011   BMI 23.10 kg/m   Specialty Comments:  No specialty comments available.  PMFS History: Patient Active Problem List   Diagnosis Date Noted  . Breast atypical lobular hyperplasia 06/08/2013   Past Medical History:  Diagnosis Date  . H/O colonoscopy 10/2012  . Medical history non-contributory     Family History  Problem Relation Age of Onset  . Breast cancer Mother   . Breast cancer Sister     Past Surgical History:  Procedure Laterality Date  . ABDOMINAL HYSTERECTOMY  2012  . BREAST BIOPSY    . BREAST EXCISIONAL BIOPSY Left    benign  . BREAST LUMPECTOMY WITH NEEDLE LOCALIZATION Left 08/24/2013   Procedure: BREAST LUMPECTOMY WITH NEEDLE LOCALIZATION;  Surgeon: Harl Bowie, MD;  Location:  [place];  Service: General;  Laterality: Left;  . COLONOSCOPY     Social History   Occupational History  . Not on file  Tobacco Use  . Smoking status: Former Smoker    Last attempt to quit: 08/21/1990    Years since quitting: 28.0  . Smokeless tobacco: Never Used  Substance and Sexual Activity  . Alcohol use: Yes    Comment: 2-4 glasses of wine  . Drug use: No  . Sexual activity: Not on file

## 2018-11-24 ENCOUNTER — Ambulatory Visit
Admission: RE | Admit: 2018-11-24 | Discharge: 2018-11-24 | Disposition: A | Discharge status: Home / Self Care | Payer: Federal, State, Local not specified - PPO | Source: Ambulatory Visit | Attending: Family Medicine | Admitting: Family Medicine

## 2018-11-24 ENCOUNTER — Other Ambulatory Visit: Payer: Self-pay

## 2018-11-24 ENCOUNTER — Other Ambulatory Visit: Payer: Self-pay | Admitting: Family Medicine

## 2018-11-24 DIAGNOSIS — M545 Low back pain, unspecified: Secondary | ICD-10-CM

## 2019-08-09 ENCOUNTER — Other Ambulatory Visit: Payer: Self-pay | Admitting: Obstetrics & Gynecology

## 2019-08-09 DIAGNOSIS — Z1231 Encounter for screening mammogram for malignant neoplasm of breast: Secondary | ICD-10-CM

## 2019-09-18 ENCOUNTER — Ambulatory Visit
Admission: RE | Admit: 2019-09-18 | Discharge: 2019-09-18 | Disposition: A | Discharge status: Home / Self Care | Payer: Federal, State, Local not specified - PPO | Source: Ambulatory Visit | Attending: Obstetrics & Gynecology | Admitting: Obstetrics & Gynecology

## 2019-09-18 ENCOUNTER — Other Ambulatory Visit: Payer: Self-pay

## 2019-09-18 DIAGNOSIS — Z1231 Encounter for screening mammogram for malignant neoplasm of breast: Secondary | ICD-10-CM

## 2019-10-01 ENCOUNTER — Other Ambulatory Visit: Payer: Self-pay | Admitting: Orthopaedic Surgery

## 2019-10-01 ENCOUNTER — Other Ambulatory Visit: Payer: Self-pay | Admitting: Obstetrics & Gynecology

## 2019-10-01 DIAGNOSIS — Z803 Family history of malignant neoplasm of breast: Secondary | ICD-10-CM

## 2020-12-11 ENCOUNTER — Other Ambulatory Visit: Payer: Self-pay | Admitting: Family Medicine

## 2020-12-11 DIAGNOSIS — Z1231 Encounter for screening mammogram for malignant neoplasm of breast: Secondary | ICD-10-CM

## 2021-02-09 ENCOUNTER — Other Ambulatory Visit: Payer: Self-pay

## 2021-02-09 ENCOUNTER — Ambulatory Visit
Admission: RE | Admit: 2021-02-09 | Discharge: 2021-02-09 | Disposition: A | Discharge status: Home / Self Care | Payer: Federal, State, Local not specified - PPO | Source: Ambulatory Visit | Attending: Family Medicine | Admitting: Family Medicine

## 2021-02-09 DIAGNOSIS — Z1231 Encounter for screening mammogram for malignant neoplasm of breast: Secondary | ICD-10-CM

## 2021-08-07 ENCOUNTER — Other Ambulatory Visit: Payer: Self-pay | Admitting: Family Medicine

## 2021-08-07 DIAGNOSIS — Z1231 Encounter for screening mammogram for malignant neoplasm of breast: Secondary | ICD-10-CM

## 2022-03-08 ENCOUNTER — Ambulatory Visit: Payer: Federal, State, Local not specified - PPO

## 2022-03-18 ENCOUNTER — Ambulatory Visit
Admission: RE | Admit: 2022-03-18 | Discharge: 2022-03-18 | Disposition: A | Discharge status: Home / Self Care | Payer: Federal, State, Local not specified - PPO | Source: Ambulatory Visit | Attending: Family Medicine | Admitting: Family Medicine

## 2022-03-18 DIAGNOSIS — Z1231 Encounter for screening mammogram for malignant neoplasm of breast: Secondary | ICD-10-CM

## 2022-04-26 IMAGING — MG MM DIGITAL SCREENING BILAT W/ TOMO AND CAD
8 series · 8 of 24 positions shown · non-contrast
Comparison: Previous exam(s).

CLINICAL DATA: Screening.

EXAM:
DIGITAL SCREENING BILATERAL MAMMOGRAM WITH TOMOSYNTHESIS AND CAD
TECHNIQUE: Bilateral screening digital craniocaudal and mediolateral oblique
mammograms were obtained. Bilateral screening digital breast
tomosynthesis was performed. The images were evaluated with
computer-aided detection.

[R CC synth-2D]
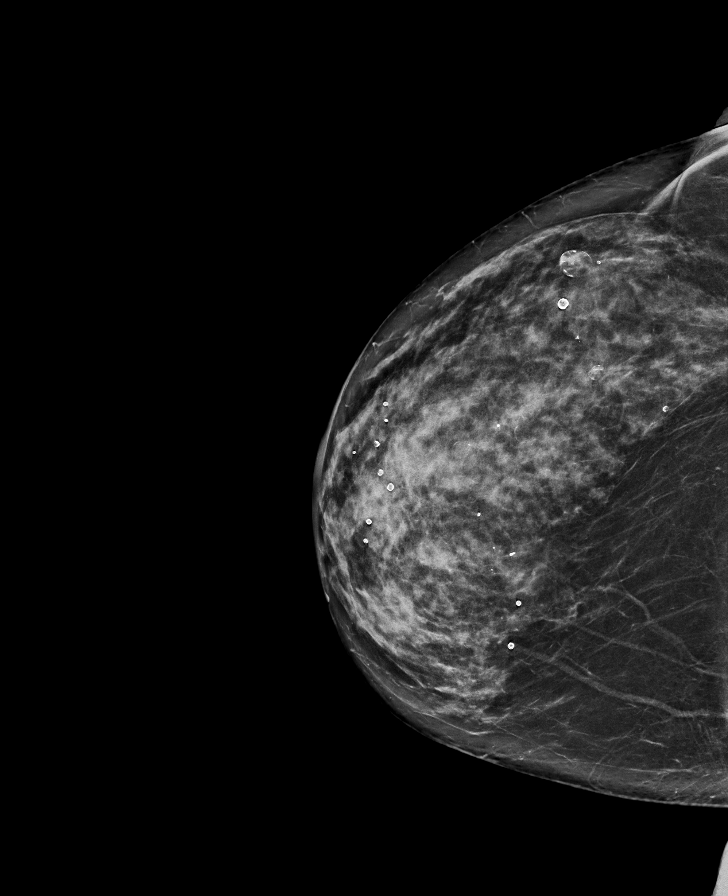

[L CC synth-2D]
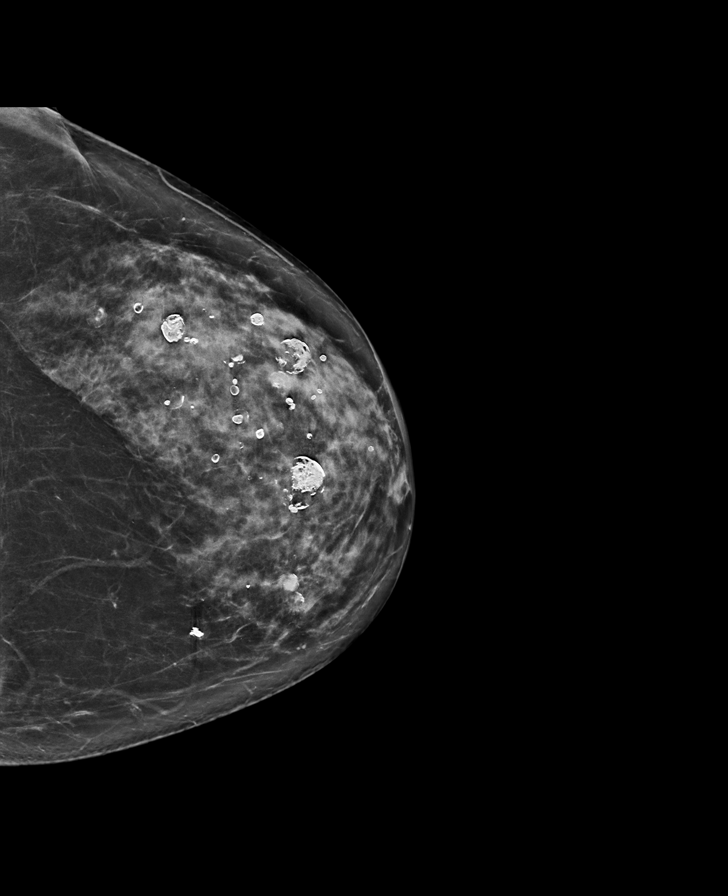

[R MLO synth-2D]
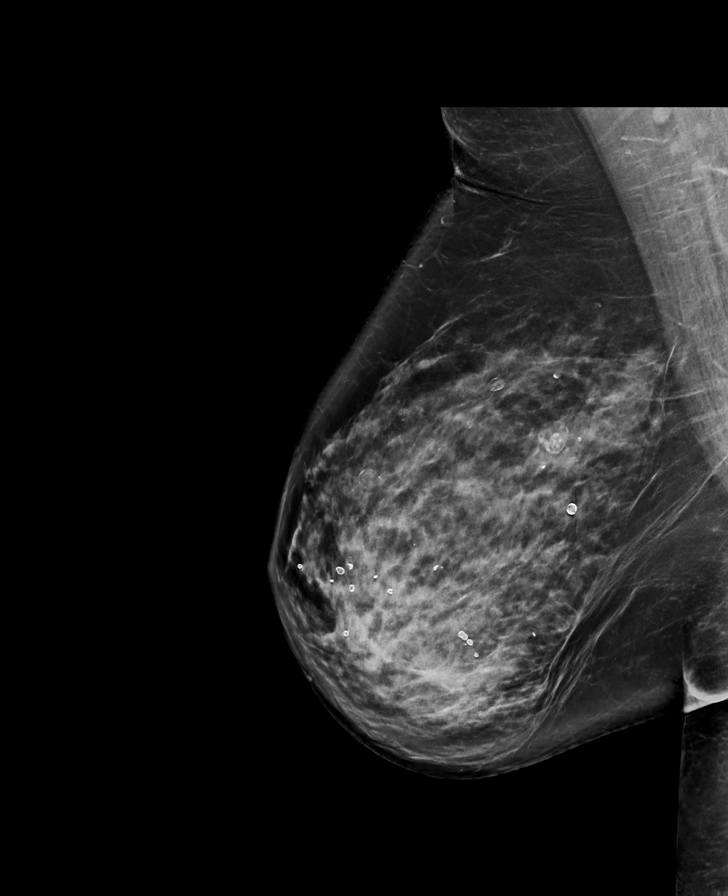

[L MLO synth-2D]
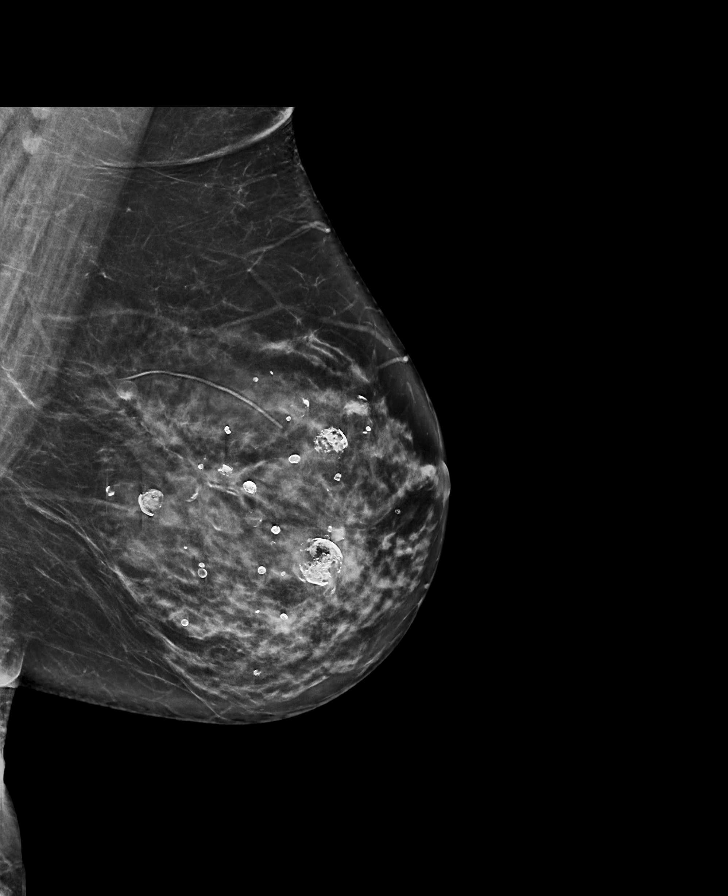

[R MLO tomo · tomo slice 49/98.0]
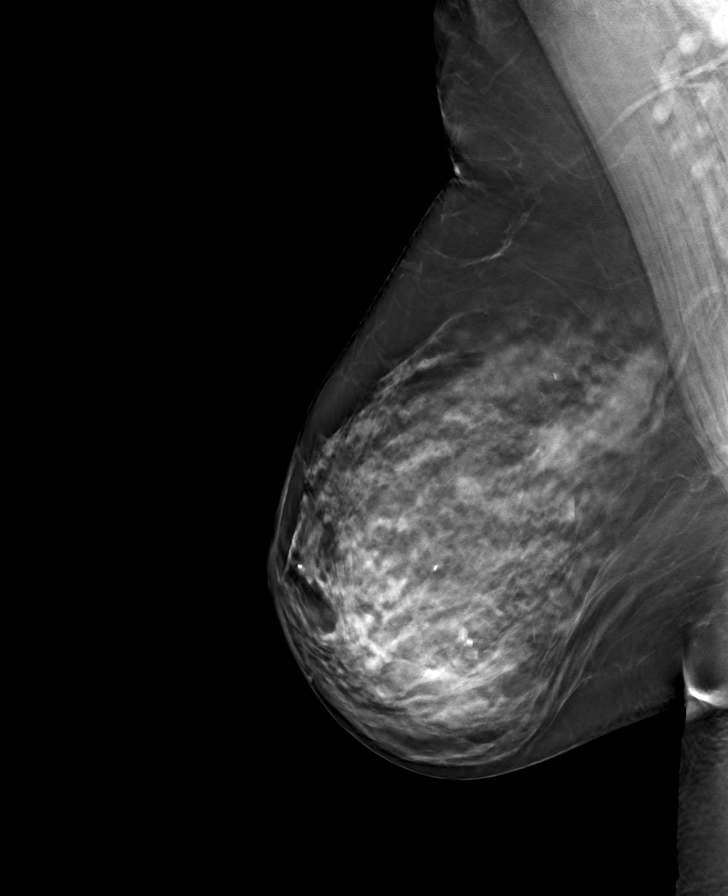

[L MLO tomo · tomo slice 43/84.0]
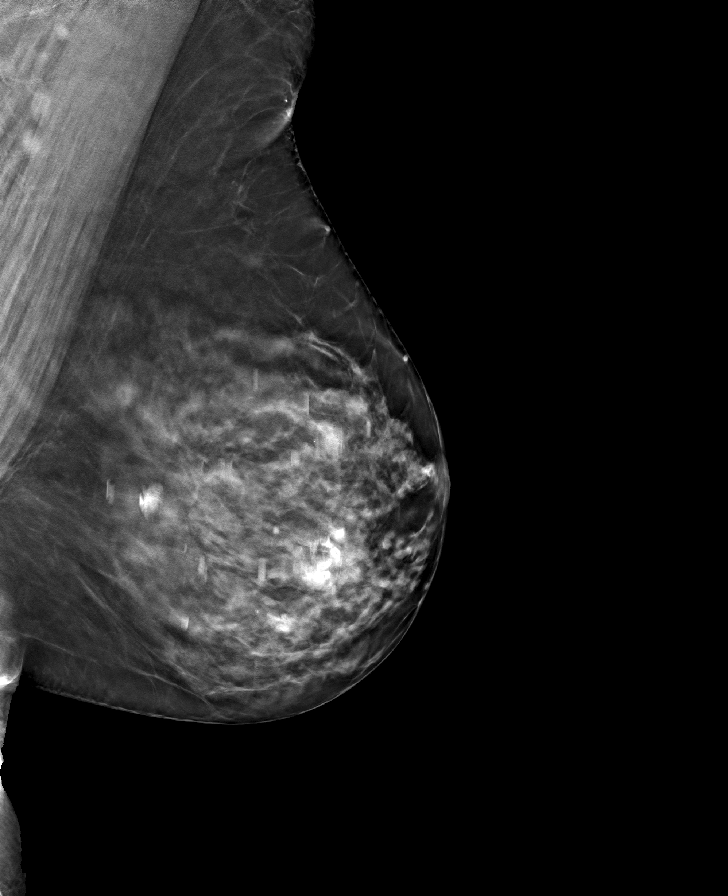

[R CC tomo · tomo slice 41/81.0]
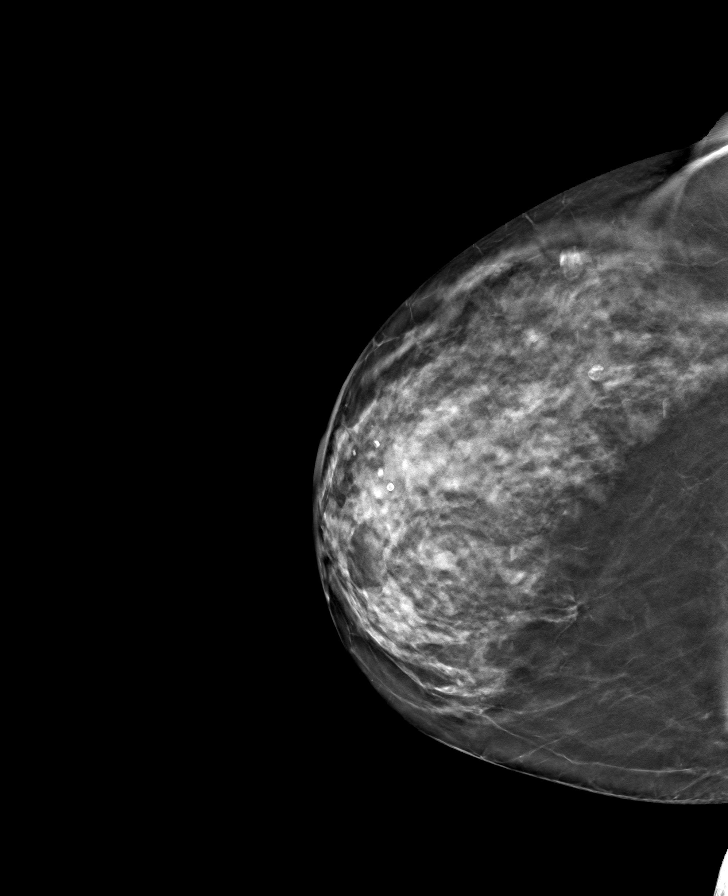

[L CC tomo · tomo slice 41/81.0]
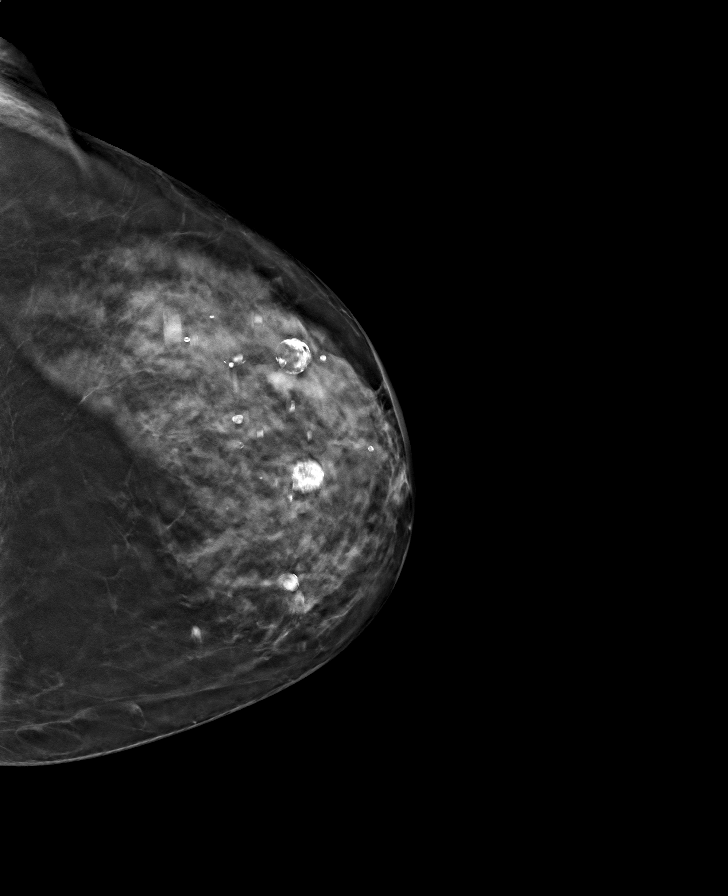

[8 of 24 positions shown; findings below may reference images not displayed]

ACR Breast Density Category c: The breast tissue is heterogeneously
dense, which may obscure small masses.
FINDINGS: There are no findings suspicious for malignancy.
IMPRESSION: No mammographic evidence of malignancy. A result letter of this
screening mammogram will be mailed directly to the patient.

RECOMMENDATION:
Screening mammogram in one year. (Code:Q3-W-BC3)

BI-RADS CATEGORY  1: Negative.

## 2023-05-02 ENCOUNTER — Other Ambulatory Visit: Payer: Self-pay | Admitting: Obstetrics and Gynecology

## 2023-05-02 DIAGNOSIS — Z1231 Encounter for screening mammogram for malignant neoplasm of breast: Secondary | ICD-10-CM

## 2023-05-03 ENCOUNTER — Inpatient Hospital Stay
Admission: RE | Admit: 2023-05-03 | Discharge: 2023-05-03 | Discharge status: Home / Self Care | Payer: Federal, State, Local not specified - PPO | Source: Ambulatory Visit | Attending: Obstetrics and Gynecology | Admitting: Obstetrics and Gynecology

## 2023-05-03 DIAGNOSIS — Z1231 Encounter for screening mammogram for malignant neoplasm of breast: Secondary | ICD-10-CM

## 2023-09-22 ENCOUNTER — Telehealth: Payer: Self-pay | Admitting: Gastroenterology

## 2023-09-22 NOTE — Telephone Encounter (Signed)
 Good morning Dr. Lavon Paganini  The following patient is being referred to [place] for a colonoscopy. She has been a patient of Dr. Kinnie Scales who has retired, and is looking to establish care with a new GI. Her last colonoscopy was 5 years ago. I requested records to be sent to [place]. Please review and advise of scheduling. Thank you.

## 2023-09-23 NOTE — Telephone Encounter (Signed)
Ok to schedule direct. Thanks

## 2023-10-18 ENCOUNTER — Encounter: Payer: Self-pay | Admitting: Gastroenterology

## 2023-12-08 ENCOUNTER — Ambulatory Visit (AMBULATORY_SURGERY_CENTER)

## 2023-12-08 ENCOUNTER — Telehealth: Payer: Self-pay

## 2023-12-08 VITALS — Ht 69.0 in | Wt 202.0 lb

## 2023-12-08 DIAGNOSIS — Z860100 Personal history of colon polyps, unspecified: Secondary | ICD-10-CM

## 2023-12-08 DIAGNOSIS — Z800 Family history of malignant neoplasm of digestive organs: Secondary | ICD-10-CM

## 2023-12-08 MED ORDER — NA SULFATE-K SULFATE-MG SULF 17.5-3.13-1.6 GM/177ML PO SOLN: 1.0000 | Freq: Once | ORAL | 0 refills | Status: AC

## 2023-12-08 NOTE — Progress Notes (Signed)
 No egg or soy allergy known to patient  No issues known to pt with past sedation with any surgeries or procedures Patient denies ever being told they had issues or difficulty with intubation  No FH of Malignant Hyperthermia Pt is not on diet pills Pt is not on home 02  Pt is not on blood thinners  Pt denies issues with constipation  No A fib or A flutter Have any cardiac testing pending--no Pt instructed to use Singlecare.com or GoodRx for a price reduction on prep  Ambulates independently Patient cleared at [facility] by Edison Gore, CRNA

## 2023-12-08 NOTE — Telephone Encounter (Signed)
 Christina the triage nurses do not get the records will need to see if the CMA's have those.

## 2023-12-08 NOTE — Telephone Encounter (Signed)
 While completing PV, patient asked if her records from previous gastroenterologist are in our system. RN could not locate these records. Patient stated she has had a problem with this. She thought she had done what was required to have the records transferred.   RN told patient she would inquire about obtaining these records.

## 2023-12-14 NOTE — Telephone Encounter (Signed)
 Appt has been set up as requested

## 2023-12-14 NOTE — Telephone Encounter (Signed)
 Dr Leonia Raman, This patient has a procedure coming up with you on 6/30. In Media  is her referral and her  colonoscopy with Dr Andriette Keeling, and it  was Normal, its in the notes  Her referral with attached notes is in Media ,   [place], Keep in mind that records are now scanned into Media attached to the referral  Thanks

## 2023-12-14 NOTE — Telephone Encounter (Signed)
 Patient is due for surveillance colonoscopy, has a family history of colon cancer.  Last colonoscopy in 2019 by Dr. Andriette Keeling.  Okay to schedule direct colonoscopy next available appointment.  Thank you

## 2023-12-25 ENCOUNTER — Telehealth: Payer: Self-pay | Admitting: Nurse Practitioner

## 2023-12-25 NOTE — Telephone Encounter (Signed)
 Patient called the answering service today around 3:30 PM.  She left a message that she needed to cancel her 8:30 AM colonoscopy.  She has a headache, fever and chills.  I attempted to call her back but got her voicemail..  Left message on voicemail asking her to call the office when she feels better and we will get her rescheduled for the colonoscopy.

## 2023-12-26 ENCOUNTER — Encounter: Admitting: Gastroenterology

## 2024-03-13 ENCOUNTER — Ambulatory Visit: Admitting: Gastroenterology

## 2024-03-13 ENCOUNTER — Encounter: Payer: Self-pay | Admitting: Gastroenterology

## 2024-03-13 VITALS — BP 147/86 | HR 67 | Temp 97.9°F | Resp 9 | Ht 69.0 in | Wt 202.0 lb

## 2024-03-13 DIAGNOSIS — Z860101 Personal history of adenomatous and serrated colon polyps: Secondary | ICD-10-CM

## 2024-03-13 DIAGNOSIS — K5730 Diverticulosis of large intestine without perforation or abscess without bleeding: Secondary | ICD-10-CM | POA: Diagnosis not present

## 2024-03-13 DIAGNOSIS — K635 Polyp of colon: Secondary | ICD-10-CM | POA: Diagnosis not present

## 2024-03-13 DIAGNOSIS — K644 Residual hemorrhoidal skin tags: Secondary | ICD-10-CM | POA: Diagnosis not present

## 2024-03-13 DIAGNOSIS — Z800 Family history of malignant neoplasm of digestive organs: Secondary | ICD-10-CM

## 2024-03-13 DIAGNOSIS — Z1211 Encounter for screening for malignant neoplasm of colon: Secondary | ICD-10-CM

## 2024-03-13 DIAGNOSIS — Z860100 Personal history of colon polyps, unspecified: Secondary | ICD-10-CM

## 2024-03-13 DIAGNOSIS — D123 Benign neoplasm of transverse colon: Secondary | ICD-10-CM

## 2024-03-13 DIAGNOSIS — K648 Other hemorrhoids: Secondary | ICD-10-CM

## 2024-03-13 DIAGNOSIS — D127 Benign neoplasm of rectosigmoid junction: Secondary | ICD-10-CM

## 2024-03-13 MED ORDER — SODIUM CHLORIDE 0.9 % IV SOLN: 500.0000 mL | Freq: Once | INTRAVENOUS | Status: DC

## 2024-03-13 NOTE — Progress Notes (Unsigned)
 [facility] History and Physical   Primary Care Physician:  Debrah Josette ORN., PA-C   Reason for Procedure:  History of adenomatous colon polyps, family h/o colon cancer mother  Plan:    Surveillance colonoscopy with possible interventions as needed     HPI: Regina Rogers is a very pleasant 63 y.o. female here for surveillance colonoscopy. Denies any nausea, vomiting, abdominal pain, melena or bright red blood per rectum  The risks and benefits as well as alternatives of endoscopic procedure(s) have been discussed and reviewed. All questions answered. The patient agrees to proceed.    Past Medical History:  Diagnosis Date   H/O colonoscopy 10/26/2012   Hyperlipidemia    Hypertension    Medical history non-contributory     Past Surgical History:  Procedure Laterality Date   BREAST BIOPSY     BREAST EXCISIONAL BIOPSY Left    benign   BREAST LUMPECTOMY WITH NEEDLE LOCALIZATION Left 08/24/2013   Procedure: BREAST LUMPECTOMY WITH NEEDLE LOCALIZATION;  Surgeon: Vicenta DELENA Poli, MD;  Location: [facility];  Service: General;  Laterality: Left;   COLONOSCOPY      Prior to Admission medications   Medication Sig Start Date End Date Taking? Authorizing Provider  amLODipine (NORVASC) 5 MG tablet Take 1 tablet by mouth daily. 09/20/23  Yes [provider]  hydrochlorothiazide (HYDRODIURIL) 25 MG tablet Take 25 mg by mouth daily.   Yes [provider]  rosuvastatin (CRESTOR) 10 MG tablet Take 10 mg by mouth daily.   Yes [provider]  Diclofenac  Sodium 3 % GEL Place 2 g onto the skin 4 (four) times daily as needed. Patient not taking: Reported on 12/08/2023 08/21/18   Rayburn, Elouise Phlegm, PA-C  hydrochlorothiazide (HYDRODIURIL) 50 MG tablet Take 50 mg by mouth as needed.  Patient not taking: Reported on 03/13/2024 05/09/13   [provider]  HYDROcodone -acetaminophen  (NORCO) 5-325 MG per tablet Take  1-2 tablets by mouth every 4 (four) hours as needed for moderate pain. Patient not taking: Reported on 12/08/2023 08/24/13   Poli Vicenta, MD    Current Outpatient Medications  Medication Sig Dispense Refill   amLODipine (NORVASC) 5 MG tablet Take 1 tablet by mouth daily.     hydrochlorothiazide (HYDRODIURIL) 25 MG tablet Take 25 mg by mouth daily.     rosuvastatin (CRESTOR) 10 MG tablet Take 10 mg by mouth daily.     Diclofenac  Sodium 3 % GEL Place 2 g onto the skin 4 (four) times daily as needed. (Patient not taking: Reported on 12/08/2023) 1 Tube 2   hydrochlorothiazide (HYDRODIURIL) 50 MG tablet Take 50 mg by mouth as needed.  (Patient not taking: Reported on 03/13/2024)     HYDROcodone -acetaminophen  (NORCO) 5-325 MG per tablet Take 1-2 tablets by mouth every 4 (four) hours as needed for moderate pain. (Patient not taking: Reported on 12/08/2023) 30 tablet 0   Current Facility-Administered Medications  Medication Dose Route Frequency Provider Last Rate Last Admin   0.9 %  sodium chloride  infusion  500 mL Intravenous Once Isabella Ida V, MD        Allergies as of 03/13/2024 - Review Complete 03/13/2024  Allergen Reaction Noted   Lisinopril Cough 09/20/2023    Family History  Problem Relation Age of Onset   Colon cancer Mother    Breast cancer Mother 53   Ovarian cancer Mother 8   Breast cancer Sister 76   Esophageal cancer Neg Hx    Rectal cancer Neg Hx  Stomach cancer Neg Hx     Social History   Socioeconomic History   Marital status: Married    Spouse name: Not on file   Number of children: Not on file   Years of education: Not on file   Highest education level: Not on file  Occupational History   Not on file  Tobacco Use   Smoking status: Former    Current packs/day: 0.00    Types: Cigarettes    Quit date: 08/21/1990    Years since quitting: 33.5   Smokeless tobacco: Never  Vaping Use   Vaping status: Never Used  Substance and Sexual Activity    Alcohol use: Yes    Comment: 2-4 glasses of wine   Drug use: No   Sexual activity: Not on file  Other Topics Concern   Not on file  Social History Narrative   Not on file   Social Drivers of Health   Financial Resource Strain: Not on file  Food Insecurity: Low Risk  (09/19/2023)   Received from [facility]   Hunger Vital Sign    Within the past 12 months, you worried that your food would run out before you got money to buy more: Never true    Within the past 12 months, the food you bought just didn't last and you didn't have money to get more. : Never true  Transportation Needs: No Transportation Needs (09/19/2023)   Received from [facility]    In the past 12 months, has lack of reliable transportation kept you from medical appointments, meetings, work or from getting things needed for daily living? : No  Physical Activity: Not on file  Stress: Not on file  Social Connections: Unknown (11/08/2021)   [facility]   Social Network    Social Network: Not on file  Intimate Partner Violence: Unknown (09/30/2021)   Received from [facility]   HITS    Physically Hurt: Not on file    Insult or Talk Down To: Not on file    Threaten Physical Harm: Not on file    Scream or Curse: Not on file    Review of Systems:  All other review of systems negative except as mentioned in the HPI.  Physical Exam: Vital signs in last 24 hours: BP 123/85   Pulse 69   Temp 97.9 F (36.6 C) (Temporal)   Resp (!) 22   Ht 5' 9 (1.753 m)   Wt 202 lb (91.6 kg)   LMP 03/08/2011   SpO2 98%   BMI 29.83 kg/m  General:   Alert, NAD Lungs:  Clear .   Heart:  Regular rate and rhythm Abdomen:  Soft, nontender and nondistended. Neuro/Psych:  Alert and cooperative. Normal mood and affect. A and O x 3  Reviewed labs, radiology imaging, old records and pertinent past GI work up  Patient is appropriate for planned procedure(s) and anesthesia in an ambulatory  setting   K. Veena Nathania Waldman , MD [phone]

## 2024-03-13 NOTE — Patient Instructions (Addendum)
 Polyps handout given  Resume previous diet. Continue present medications. Await pathology result. Repeat colonoscopy in 1 year for surveillance after piecemeal polypectomy and for surveillance based on pathology results.    YOU HAD AN ENDOSCOPIC PROCEDURE TODAY AT THE [facility]:   Refer to the procedure report that was given to you for any specific questions about what was found during the examination.  If the procedure report does not answer your questions, please call your gastroenterologist to clarify.  If you requested that your care partner not be given the details of your procedure findings, then the procedure report has been included in a sealed envelope for you to review at your convenience later.  YOU SHOULD EXPECT: Some feelings of bloating in the abdomen. Passage of more gas than usual.  Walking can help get rid of the air that was put into your GI tract during the procedure and reduce the bloating. If you had a lower endoscopy (such as a colonoscopy or flexible sigmoidoscopy) you may notice spotting of blood in your stool or on the toilet paper. If you underwent a bowel prep for your procedure, you may not have a normal bowel movement for a few days.  Please Note:  You might notice some irritation and congestion in your nose or some drainage.  This is from the oxygen used during your procedure.  There is no need for concern and it should clear up in a day or so.  SYMPTOMS TO REPORT IMMEDIATELY:  Following lower endoscopy (colonoscopy or flexible sigmoidoscopy):  Excessive amounts of blood in the stool  Significant tenderness or worsening of abdominal pains  Swelling of the abdomen that is new, acute  Fever of 100F or higher   For urgent or emergent issues, a gastroenterologist can be reached at any hour by calling [phone]. Do not use MyChart messaging for urgent concerns.    DIET:  We do recommend a small meal at first, but then you may proceed to your  regular diet.  Drink plenty of fluids but you should avoid alcoholic beverages for 24 hours.  ACTIVITY:  You should plan to take it easy for the rest of today and you should NOT DRIVE or use heavy machinery until tomorrow (because of the sedation medicines used during the test).    FOLLOW UP: Our staff will call the number listed on your records the next business day following your procedure.  We will call around 7:15- 8:00 am to check on you and address any questions or concerns that you may have regarding the information given to you following your procedure. If we do not reach you, we will leave a message.     If any biopsies were taken you will be contacted by phone or by letter within the next 1-3 weeks.  Please call us  at [phone] if you have not heard about the biopsies in 3 weeks.    SIGNATURES/CONFIDENTIALITY: You and/or your care partner have signed paperwork which will be entered into your electronic medical record.  These signatures attest to the fact that that the information above on your After Visit Summary has been reviewed and is understood.  Full responsibility of the confidentiality of this discharge information lies with you and/or your care-partner.

## 2024-03-13 NOTE — Op Note (Signed)
 [facility] Patient Name: Regina Rogers Procedure Date: 03/13/2024 10:29 AM MRN: [id number] Endoscopist: Gustav ALONSO Mcgee , MD, [id number] Age: 63 Referring MD:  Date of Birth: Jun 12, 1961 Gender: Female Account #: [id number] Procedure:                Colonoscopy Indications:              Screening in patient at increased risk: Colorectal                            cancer in mother before age 34, High risk colon                            cancer surveillance: Personal history of colonic                            polyps, Last colonoscopy: date unknown (unable to                            locate last colonoscopy report) Medicines:                Monitored Anesthesia Care Procedure:                Pre-Anesthesia Assessment:                           - Prior to the procedure, a History and Physical                            was performed, and patient medications and                            allergies were reviewed. The patient's tolerance of                            previous anesthesia was also reviewed. The risks                            and benefits of the procedure and the sedation                            options and risks were discussed with the patient.                            All questions were answered, and informed consent                            was obtained. Prior Anticoagulants: The patient has                            taken no anticoagulant or antiplatelet agents. ASA                            Grade Assessment: II - A patient with mild systemic  disease. After reviewing the risks and benefits,                            the patient was deemed in satisfactory condition to                            undergo the procedure.                           After obtaining informed consent, the colonoscope                            was passed under direct vision. Throughout the                            procedure, the  patient's blood pressure, pulse, and                            oxygen saturations were monitored continuously. The                            PCF-HQ190L Colonoscope [id number] was introduced                            through the anus and advanced to the the cecum,                            identified by appendiceal orifice and ileocecal                            valve. The colonoscopy was performed without                            difficulty. The patient tolerated the procedure                            well. Scope In: 10:47:47 AM Scope Out: 11:12:34 AM Scope Withdrawal Time: 0 hours 19 minutes 55 seconds  Total Procedure Duration: 0 hours 24 minutes 47 seconds  Findings:                 The perianal and digital rectal examinations were                            normal.                           Two sessile polyps were found in the transverse                            colon. The polyps were 4 to 5 mm in size. These                            polyps were removed with a cold snare. Resection  and retrieval were complete.                           A post polypectomy scar was found in the                            recto-sigmoid colon. There was residual polyp                            tissue. The polyp was removed with a piecemeal                            technique using a cold snare. Resection and                            retrieval were complete.                           Scattered small-mouthed diverticula were found in                            the sigmoid colon.                           Non-bleeding external and internal hemorrhoids were                            found during retroflexion. The hemorrhoids were                            small. Complications:            No immediate complications. Estimated Blood Loss:     Estimated blood loss was minimal. Impression:               - Two 4 to 5 mm polyps in the transverse colon,                             removed with a cold snare. Resected and retrieved.                           - Post-polypectomy scar in the recto-sigmoid colon.                           - Diverticulosis in the sigmoid colon.                           - Non-bleeding external and internal hemorrhoids. Recommendation:           - Patient has a contact number available for                            emergencies. The signs and symptoms of potential                            delayed complications were discussed with the  patient. Return to normal activities tomorrow.                            Written discharge instructions were provided to the                            patient.                           - Resume previous diet.                           - Continue present medications.                           - Await pathology results.                           - Repeat colonoscopy in 1 year for surveillance                            after piecemeal polypectomy and for surveillance                            based on pathology results. Trayvon Trumbull V. Makhai Fulco, MD 03/13/2024 11:25:53 AM This report has been signed electronically.

## 2024-03-13 NOTE — Progress Notes (Signed)
 Called to room to assist during endoscopic procedure.  Patient ID and intended procedure confirmed with present staff. Received instructions for my participation in the procedure from the performing physician.

## 2024-03-13 NOTE — Progress Notes (Unsigned)
 To pacu, VSS. Report to RN.tb

## 2024-03-13 NOTE — Progress Notes (Unsigned)
 Pt's states no medical or surgical changes since previsit or office visit.

## 2024-03-14 ENCOUNTER — Telehealth: Payer: Self-pay

## 2024-03-14 NOTE — Telephone Encounter (Signed)
  Follow up Call-     03/13/2024    9:50 AM  Call back number  Post procedure Call Back phone  # [phone]  Permission to leave phone message Yes     Patient questions:  Do you have a fever, pain , or abdominal swelling? No. Pain Score  0 *  Have you tolerated food without any problems? Yes.    Have you been able to return to your normal activities? Yes.    Do you have any questions about your discharge instructions: Diet   No. Medications  No. Follow up visit  No.  Do you have questions or concerns about your Care? No.  Actions: * If pain score is 4 or above: No action needed, pain <4.

## 2024-03-15 LAB — SURGICAL PATHOLOGY

## 2024-03-20 ENCOUNTER — Other Ambulatory Visit: Payer: Self-pay | Admitting: Family Medicine

## 2024-03-20 DIAGNOSIS — Z1231 Encounter for screening mammogram for malignant neoplasm of breast: Secondary | ICD-10-CM

## 2024-04-25 ENCOUNTER — Ambulatory Visit: Payer: Self-pay | Admitting: Gastroenterology

## 2024-05-03 ENCOUNTER — Ambulatory Visit
Admission: RE | Admit: 2024-05-03 | Discharge: 2024-05-03 | Disposition: A | Discharge status: Home / Self Care | Source: Ambulatory Visit | Attending: Family Medicine | Admitting: Family Medicine

## 2024-05-03 DIAGNOSIS — Z1231 Encounter for screening mammogram for malignant neoplasm of breast: Secondary | ICD-10-CM
